# Patient Record
Sex: Female | Born: 1980 | Race: White | Hispanic: No | Smoking: Current every day smoker
Health system: Southern US, Community
[De-identification: ages and names within clinical notes are randomized; demographics above are authoritative.]

## PROBLEM LIST (undated history)

## (undated) DIAGNOSIS — R519 Headache, unspecified: Secondary | ICD-10-CM

## (undated) DIAGNOSIS — G2581 Restless legs syndrome: Secondary | ICD-10-CM

## (undated) DIAGNOSIS — F32A Depression, unspecified: Secondary | ICD-10-CM

## (undated) DIAGNOSIS — A6000 Herpesviral infection of urogenital system, unspecified: Secondary | ICD-10-CM

## (undated) DIAGNOSIS — F329 Major depressive disorder, single episode, unspecified: Secondary | ICD-10-CM

## (undated) DIAGNOSIS — G43909 Migraine, unspecified, not intractable, without status migrainosus: Secondary | ICD-10-CM

## (undated) DIAGNOSIS — R51 Headache: Secondary | ICD-10-CM

## (undated) HISTORY — DX: Major depressive disorder, single episode, unspecified: F32.9

## (undated) HISTORY — DX: Depression, unspecified: F32.A

## (undated) HISTORY — DX: Headache, unspecified: R51.9

## (undated) HISTORY — DX: Herpesviral infection of urogenital system, unspecified: A60.00

## (undated) HISTORY — DX: Headache: R51

## (undated) HISTORY — DX: Restless legs syndrome: G25.81

## (undated) HISTORY — DX: Migraine, unspecified, not intractable, without status migrainosus: G43.909

## (undated) HISTORY — PX: UMBILICAL HERNIA REPAIR: SHX196

---

## 2000-06-22 HISTORY — PX: LEEP: SHX91

## 2006-12-25 ENCOUNTER — Ambulatory Visit: Payer: Self-pay | Admitting: Emergency Medicine

## 2008-12-31 ENCOUNTER — Ambulatory Visit: Payer: Self-pay | Admitting: Internal Medicine

## 2010-12-22 ENCOUNTER — Ambulatory Visit: Payer: Self-pay | Admitting: Family Medicine

## 2012-06-05 ENCOUNTER — Ambulatory Visit: Payer: Self-pay | Admitting: Emergency Medicine

## 2012-07-22 IMAGING — CT CT HEAD WITHOUT CONTRAST
1 series · 16 of 28 positions shown, 20 images · non-contrast
Comparison: none

REASON FOR EXAM: HA blurred vision tremor difficulty with speech
COMMENTS:

[Series 2: 3 soft tissue · axial · 0.42mm/px · z∈[+425,+550]mm · 16 of 28 slices shown, 20 images]
[im 2/28  brain]
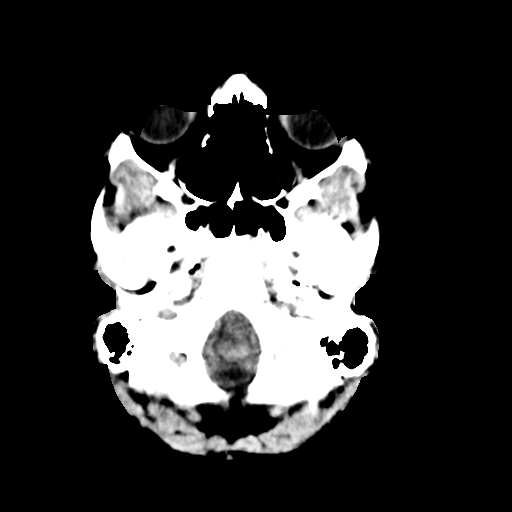
[im 2/28  bone]
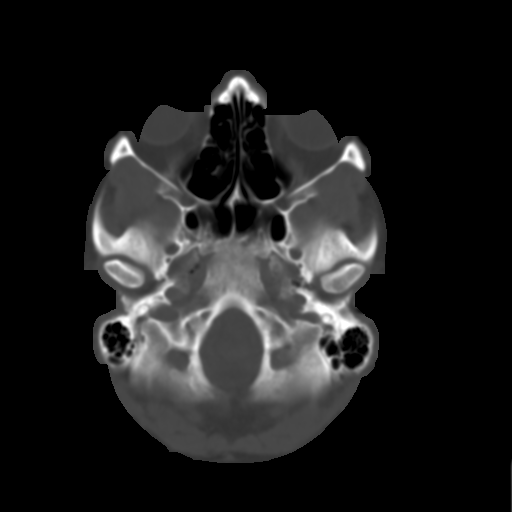
[im 4/28  brain]
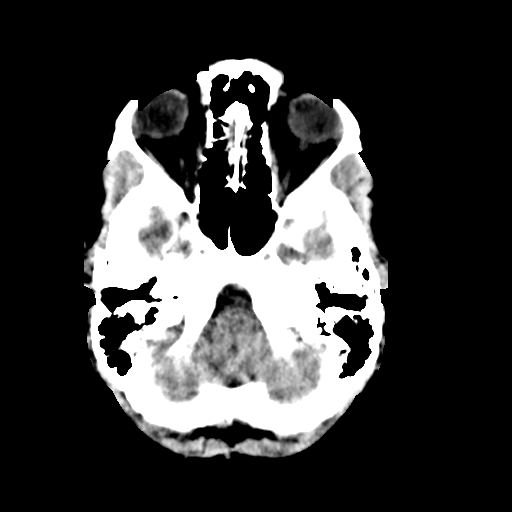
[im 6/28  brain]
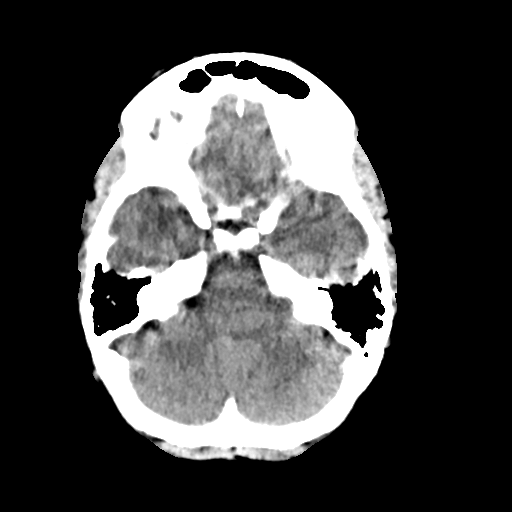
[im 7/28  brain]
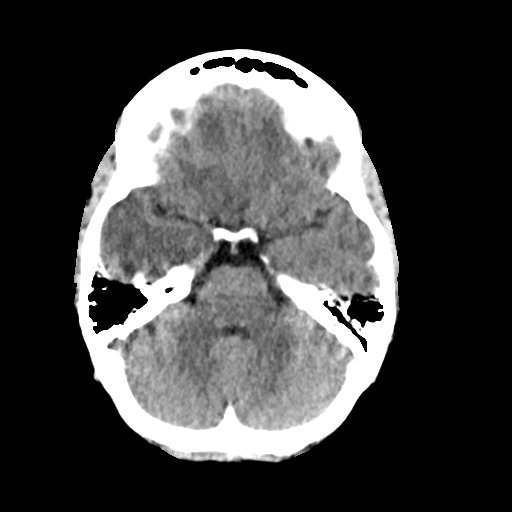
[im 9/28  brain]
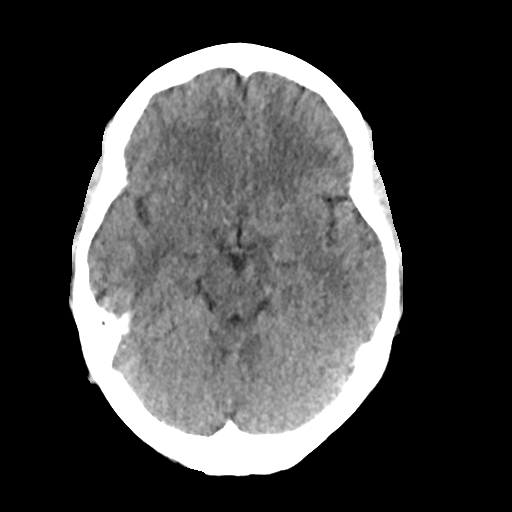
[im 9/28  bone]
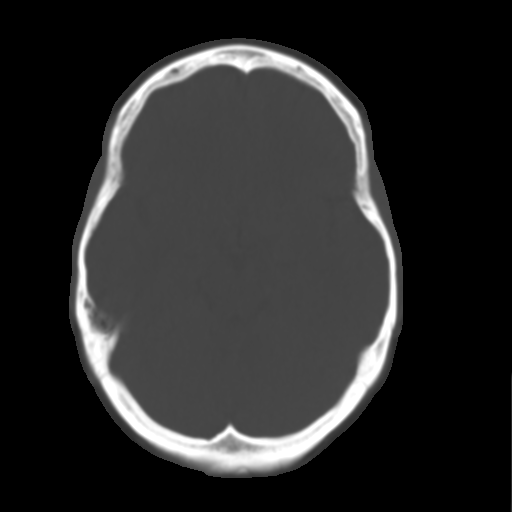
[im 10/28  brain]
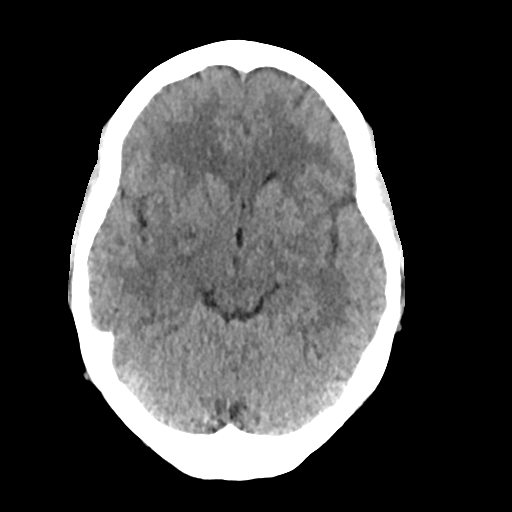
[im 12/28  brain]
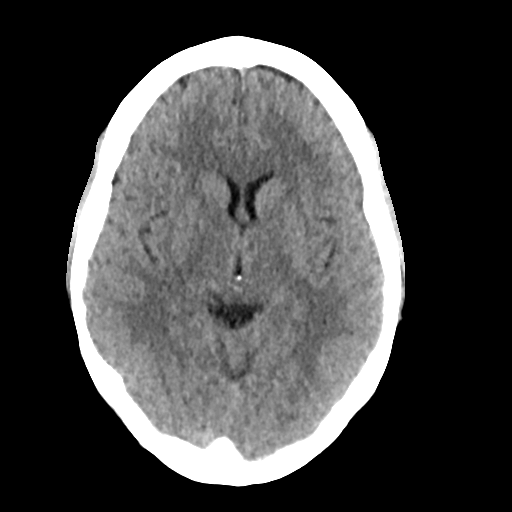
[im 14/28  brain]
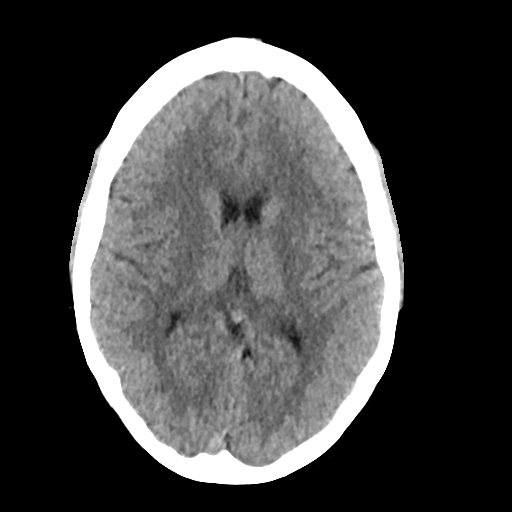
[im 15/28  brain]
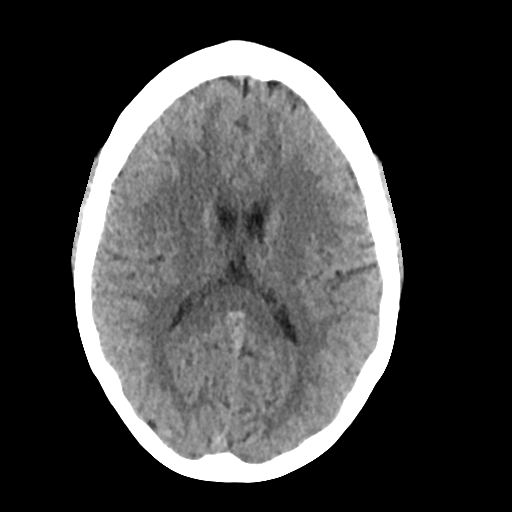
[im 15/28  bone]
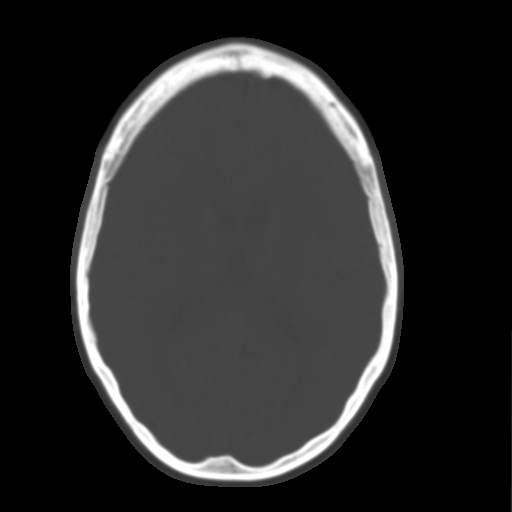
[im 17/28  brain]
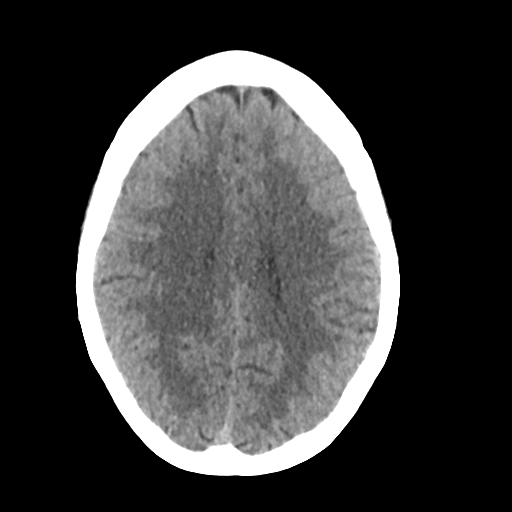
[im 19/28  brain]
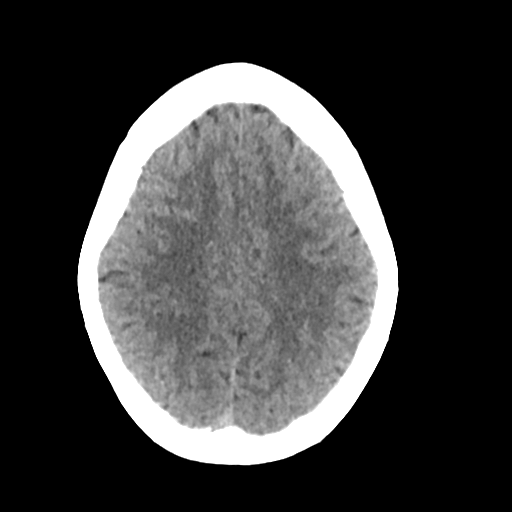
[im 20/28  brain]
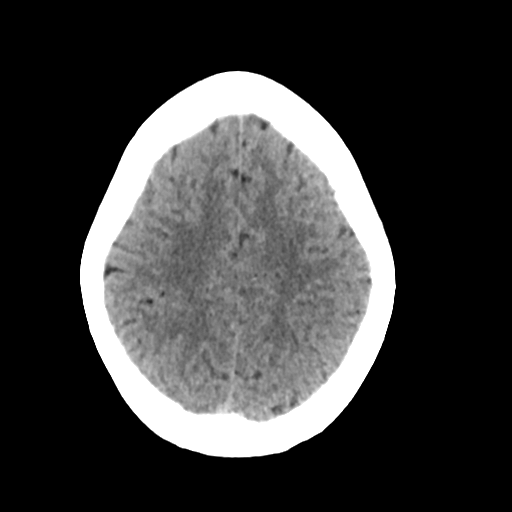
[im 22/28  brain]
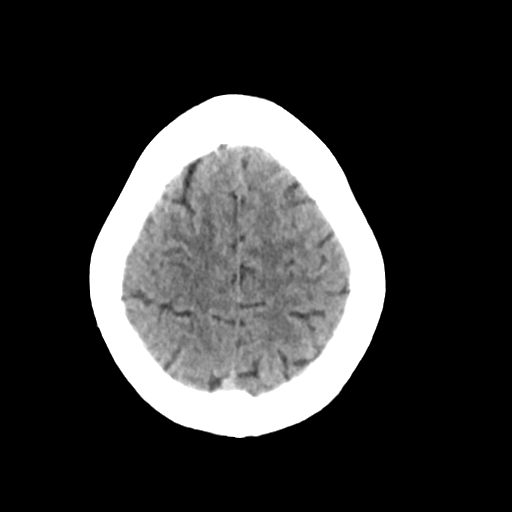
[im 22/28  bone]
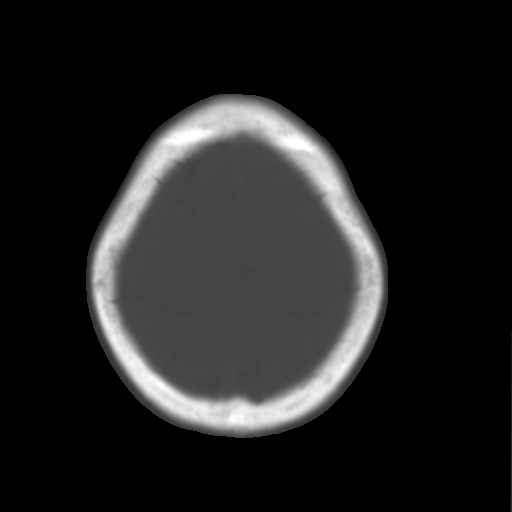
[im 23/28  brain]
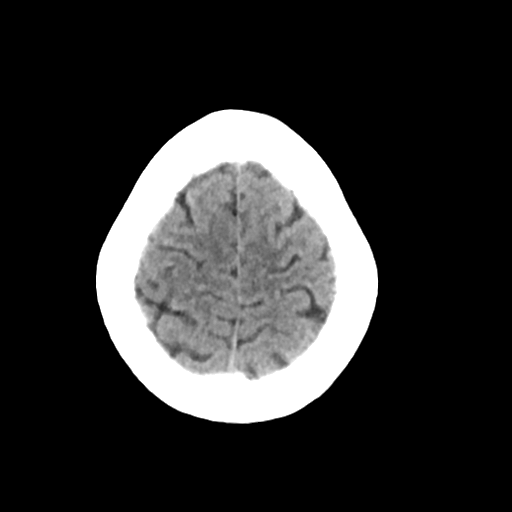
[im 25/28  brain]
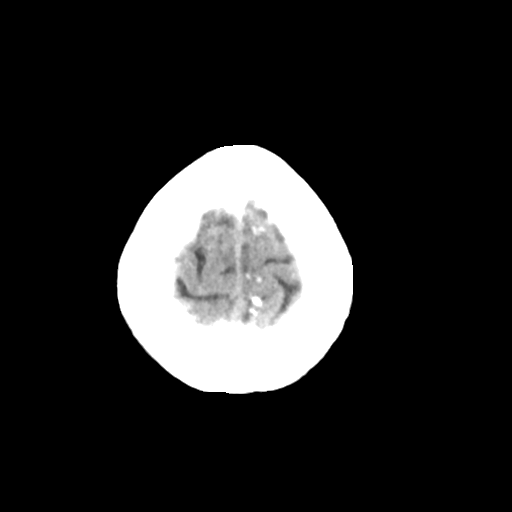
[im 27/28  brain]
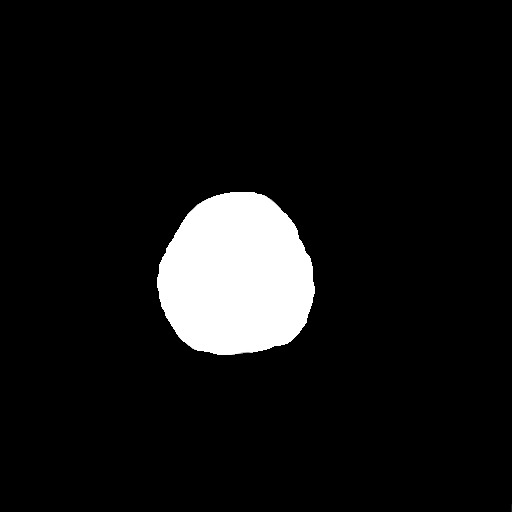

[16 of 28 positions shown; findings below may reference images not displayed]

PROCEDURE:     KCT - KCT HEAD WITHOUT CONTRAST  - December 22, 2010 [DATE]

RESULT:     Noncontrast CT of the brain is performed. The patient has no
previous exam for comparison.

The ventricles and sulci appear to be within normal limits. There is no
evidence of intracranial hemorrhage, mass or mass effect. There is no
midline shift or territorial infarct. The included sinuses and mastoid air
cells show normal aeration. The calvarium is intact.
IMPRESSION: No focal intracranial abnormality evident. If there is concern for occult
malignancy or an acute or subacute ischemic event, followup with MRI may be
beneficial.

## 2012-11-20 ENCOUNTER — Ambulatory Visit: Payer: Self-pay | Admitting: Family Medicine

## 2013-10-23 ENCOUNTER — Ambulatory Visit: Payer: Self-pay

## 2014-10-10 DIAGNOSIS — K429 Umbilical hernia without obstruction or gangrene: Secondary | ICD-10-CM | POA: Insufficient documentation

## 2015-02-04 ENCOUNTER — Ambulatory Visit (INDEPENDENT_AMBULATORY_CARE_PROVIDER_SITE_OTHER): Payer: BLUE CROSS/BLUE SHIELD | Admitting: Psychiatry

## 2015-02-04 ENCOUNTER — Encounter: Payer: Self-pay | Admitting: Psychiatry

## 2015-02-04 VITALS — BP 124/86 | HR 88 | Temp 98.9°F | Ht 63.0 in | Wt 216.2 lb

## 2015-02-04 DIAGNOSIS — R419 Unspecified symptoms and signs involving cognitive functions and awareness: Secondary | ICD-10-CM

## 2015-02-04 DIAGNOSIS — F99 Mental disorder, not otherwise specified: Secondary | ICD-10-CM

## 2015-02-04 MED ORDER — BUPROPION HCL ER (XL) 300 MG PO TB24: 300.0000 mg | ORAL_TABLET | Freq: Every day | ORAL | Status: DC
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
## 2015-03-19 ENCOUNTER — Ambulatory Visit: Payer: BLUE CROSS/BLUE SHIELD | Admitting: Psychiatry

## 2016-10-05 ENCOUNTER — Telehealth: Payer: Self-pay

## 2016-10-05 NOTE — Telephone Encounter (Signed)
Pt calling.  Has appt next week but need refill before then.  Pharm will not let her dot it.  [phone]

## 2016-10-05 NOTE — Telephone Encounter (Signed)
Left detailed msg.

## 2016-10-05 NOTE — Telephone Encounter (Signed)
We have not seen pt in almost 2 yrs. Whoever has been filling meds until now can RF until we see pt. RN to notify pt.

## 2016-10-13 ENCOUNTER — Ambulatory Visit: Payer: Self-pay | Admitting: Obstetrics and Gynecology

## 2016-10-26 ENCOUNTER — Ambulatory Visit: Payer: Self-pay | Admitting: Advanced Practice Midwife

## 2016-10-26 ENCOUNTER — Encounter: Payer: Self-pay | Admitting: Advanced Practice Midwife

## 2016-12-07 ENCOUNTER — Ambulatory Visit: Payer: Self-pay | Admitting: Obstetrics and Gynecology

## 2016-12-25 ENCOUNTER — Ambulatory Visit (INDEPENDENT_AMBULATORY_CARE_PROVIDER_SITE_OTHER): Payer: 59 | Admitting: Advanced Practice Midwife

## 2016-12-25 ENCOUNTER — Encounter: Payer: Self-pay | Admitting: Advanced Practice Midwife

## 2016-12-25 VITALS — BP 118/78 | Ht 62.0 in | Wt 195.0 lb

## 2016-12-25 DIAGNOSIS — B009 Herpesviral infection, unspecified: Secondary | ICD-10-CM | POA: Diagnosis not present

## 2016-12-25 DIAGNOSIS — Z01419 Encounter for gynecological examination (general) (routine) without abnormal findings: Secondary | ICD-10-CM | POA: Diagnosis not present

## 2016-12-25 MED ORDER — VALACYCLOVIR HCL 500 MG PO TABS: 500.0000 mg | ORAL_TABLET | Freq: Two times a day (BID) | ORAL | 2 refills | Status: AC

## 2016-12-25 NOTE — Progress Notes (Signed)
Patient ID: Jenna RileyHeidi H Pittman, female   DOB: 04/19/1981, 36 y.o.   MRN: [id number]     Gynecology Annual Exam  PCP: Patient, No Pcp Per  Chief Complaint:  Chief Complaint  Patient presents with  . Annual Exam    History of Present Illness: Patient is a 36 y.o. G0P0000 presents for annual exam. The patient has complaint today of resolving pimple at 1:00 o'clock on outer edge of right breast. She first noticed it about 6 months ago as a white head and she says it is getting smaller. She does not have concerns about it but is told to let us know if she does have concerns that we can schedule a breast ultrasound. Patient is requesting refill of valtrex today for q 6 month breakouts.   LMP: No LMP recorded. Patient is not currently having periods (Reason: IUD). Average Interval: NA Duration of flow: NA days Heavy Menses: not applicable Clots: not applicable Intermenstrual Bleeding: no Postcoital Bleeding: no Dysmenorrhea: no Spotting q 6 months  The patient is sexually active. She currently uses IUD for contraception. She denies dyspareunia.  The patient does not perform self breast exams.  There is no notable family history of breast or ovarian cancer in her family.  The patient wears seatbelts: yes.   The patient has regular exercise: yes.  She admits healthy diet. She is a 1/4 PPD cigarette smoker and states she is not ready to quit.  The patient denies current symptoms of depression.    Review of Systems: Review of Systems  Constitutional: Negative.   HENT: Negative.   Eyes: Negative.   Respiratory: Negative.   Cardiovascular: Negative.   Gastrointestinal: Negative.   Genitourinary: Negative.   Musculoskeletal: Negative.   Skin: Negative.   Neurological: Negative.   Endo/Heme/Allergies: Negative.   Psychiatric/Behavioral: Negative.     Past Medical History:  Past Medical History:  Diagnosis Date  . Depression   . Headache   . Herpes genitalia   . Migraine   .  Restless leg syndrome     Past Surgical History:  Past Surgical History:  Procedure Laterality Date  . LEEP  2002  . UMBILICAL HERNIA REPAIR      Gynecologic History:  No LMP recorded. Patient is not currently having periods (Reason: IUD). Contraception: IUD Last Pap: Results were: no abnormalities   Obstetric History: G0P0000  Family History:  Family History  Problem Relation Age of Onset  . Hypertension Mother   . Depression Mother   . Hypertension Father   . Hyperlipidemia Father   . Skin cancer Father     Social History:  Social History   Social History  . Marital status: Divorced    Spouse name: N/A  . Number of children: N/A  . Years of education: N/A   Occupational History  . Not on file.   Social History Main Topics  . Smoking status: Current Every Day Smoker    Packs/day: 0.25    Types: Cigarettes    Start date: 02/03/2010  . Smokeless tobacco: Never Used  . Alcohol use 3.0 oz/week    3 Cans of beer, 2 Shots of liquor per week  . Drug use: No  . Sexual activity: Yes    Birth control/ protection: None   Other Topics Concern  . Not on file   Social History Narrative  . No narrative on file    Allergies:  No Known Allergies  Medications: Prior to Admission medications   Medication Sig  Start Date End Date Taking? Authorizing Provider  valACYclovir (VALTREX) 500 MG tablet Take by mouth.   Yes [provider]  buPROPion (WELLBUTRIN XL) 300 MG 24 hr tablet Take 1 tablet (300 mg total) by mouth daily. Patient not taking: Reported on 12/25/2016 02/04/15   Kerin Salen, MD    Physical Exam Vitals: Blood pressure 118/78, height 5\' 2"  (1.575 m), weight 195 lb (88.5 kg).  General: NAD HEENT: normocephalic, anicteric Thyroid: no enlargement, no palpable nodules Pulmonary: No increased work of breathing, CTAB Cardiovascular: RRR, distal pulses 2+ Breast: Breast symmetrical, no tenderness, no palpable nodules or masses, no skin or nipple  retraction present, no nipple discharge.  No axillary or supraclavicular lymphadenopathy. Superficial small bump on outer edge at 1 o'clock on right breast, non tender Abdomen: NABS, soft, non-tender, non-distended.  Umbilicus without lesions.  No hepatomegaly, splenomegaly or masses palpable. No evidence of hernia  Genitourinary: deferred for PAP smear screening interval and no gyn concerns.  Extremities: no edema, erythema, or tenderness Neurologic: Grossly intact Psychiatric: mood appropriate, affect full   Assessment: 36 y.o. G0P0000 Well woman exam  Plan: Problem List Items Addressed This Visit    None    Visit Diagnoses    Well woman exam with routine gynecological exam    -  Primary      1) STI screening was offered and declined  2) ASCCP guidelines and rational discussed.  Patient opts for every 3 years screening interval  3) Contraception -Continue with IUD  4) Routine healthcare maintenance including cholesterol, diabetes screening discussed managed by PCP   5) Refill of Valtrex  6) Follow up 1 year for routine annual exam and as needed PRN   Tresea Mall, CNM

## 2017-10-20 ENCOUNTER — Ambulatory Visit (INDEPENDENT_AMBULATORY_CARE_PROVIDER_SITE_OTHER): Payer: 59 | Admitting: Advanced Practice Midwife

## 2017-10-20 ENCOUNTER — Encounter: Payer: Self-pay | Admitting: Advanced Practice Midwife

## 2017-10-20 VITALS — BP 120/80 | Ht 63.0 in | Wt 200.0 lb

## 2017-10-20 DIAGNOSIS — N6312 Unspecified lump in the right breast, upper inner quadrant: Secondary | ICD-10-CM

## 2017-10-20 DIAGNOSIS — A6000 Herpesviral infection of urogenital system, unspecified: Secondary | ICD-10-CM | POA: Diagnosis not present

## 2017-10-20 MED ORDER — VALACYCLOVIR HCL 500 MG PO TABS: 500.0000 mg | ORAL_TABLET | Freq: Two times a day (BID) | ORAL | 6 refills | Status: AC

## 2017-10-20 NOTE — Progress Notes (Signed)
Patient ID: Jenna Pittman, female   DOB: 02-Jul-1980, 37 y.o.   MRN: [id number]  Reason for Consult: Breast Mass   Referred by No ref. provider found  Subjective:     HPI: Patient was seen by me 10 months ago and at that time had a small lump on her right breast that began as a pimple and she thought it was resolving. She was not concerned about it at that time but was told to follow up if it got worse. She is here today because the lump has gotten bigger and the color has changed. It is visible when she wears a lower cut top. She denies pain at the site. She is requesting imaging to make sure it is nothing concerning. She is also requesting refill of Valacyclovir for her occasional HSV outbreaks.   Past Medical History:  Diagnosis Date  . Depression   . Headache   . Herpes genitalia   . Migraine   . Restless leg syndrome    Family History  Problem Relation Age of Onset  . Hypertension Mother   . Depression Mother   . Hypertension Father   . Hyperlipidemia Father   . Skin cancer Father    Past Surgical History:  Procedure Laterality Date  . LEEP  2002  . UMBILICAL HERNIA REPAIR      Short Social History:  Social History   Tobacco Use  . Smoking status: Current Every Day Smoker    Packs/day: 0.25    Types: Cigarettes    Start date: 02/03/2010  . Smokeless tobacco: Never Used  Substance Use Topics  . Alcohol use: Yes    Alcohol/week: 3.0 oz    Types: 3 Cans of beer, 2 Shots of liquor per week    No Known Allergies  Current Outpatient Medications  Medication Sig Dispense Refill  . valACYclovir (VALTREX) 500 MG tablet Take 1 tablet (500 mg total) by mouth 2 (two) times daily for 3 days. 6 tablet 6   No current facility-administered medications for this visit.     Review of Systems  Constitutional:  Constitutional negative. HENT:       Congestion Eyes: Eyes negative.  Respiratory: Positive for cough.  Cardiovascular: Cardiovascular negative.  GI:  Gastrointestinal negative.  GU: Genitourinary negative. Musculoskeletal: Musculoskeletal negative.  Skin:       Lump on right breast that has gotten bigger over the past 10 months Neurological: Neurological negative. Hematologic: Hematologic/lymphatic negative.  Psychiatric: Psychiatric negative.        Objective:  Objective   Vitals:   10/20/17 1348  BP: 120/80  Weight: 200 lb (90.7 kg)  Height:  (1.6 m)   Body mass index is 35.43 kg/m.  Physical Exam  Constitutional: She is oriented to person, place, and time. She appears well-developed and well-nourished.  HENT:  Head: Normocephalic.  Neck: Normal range of motion. Neck supple.  Cardiovascular: Normal rate and regular rhythm.  Pulmonary/Chest: Effort normal and breath sounds normal.  Musculoskeletal: Normal range of motion.  Neurological: She is alert and oriented to person, place, and time.  Skin: Skin is warm and dry.  1 cm x 1 cm raised lump at 1:00 on right breast. Non mobile. Non tender. Lump appears white under surface of skin with red skin around lump. Skin intact.   Psychiatric: She has a normal mood and affect. Her behavior is normal.        Assessment/Plan:    37 yo G0P0 female with Right breast  lump   Orders placed for bilateral diagnostic mammogram and bilateral ultrasound   Tresea Mall CNM [facility]

## 2017-10-21 ENCOUNTER — Telehealth: Payer: Self-pay | Admitting: Advanced Practice Midwife

## 2017-10-21 NOTE — Telephone Encounter (Signed)
I attempted to reach the patient to make her aware of an appointment at [facility] on Wed, 10/27/17 @ 2:40pm (first available) but there was no answer, and the v/m is full. No other phone#s listed in chart.

## 2017-10-22 ENCOUNTER — Telehealth: Payer: Self-pay | Admitting: Advanced Practice Midwife

## 2017-10-22 ENCOUNTER — Ambulatory Visit: Payer: 59 | Admitting: Maternal Newborn

## 2017-10-22 NOTE — Telephone Encounter (Signed)
Patient is aware of appointment and location.

## 2017-10-27 ENCOUNTER — Ambulatory Visit
Admission: RE | Admit: 2017-10-27 | Discharge: 2017-10-27 | Disposition: A | Discharge status: Home / Self Care | Payer: 59 | Source: Ambulatory Visit | Attending: Advanced Practice Midwife | Admitting: Advanced Practice Midwife

## 2017-10-27 DIAGNOSIS — N6312 Unspecified lump in the right breast, upper inner quadrant: Secondary | ICD-10-CM | POA: Insufficient documentation

## 2017-10-27 DIAGNOSIS — R928 Other abnormal and inconclusive findings on diagnostic imaging of breast: Secondary | ICD-10-CM | POA: Diagnosis not present

## 2017-10-29 ENCOUNTER — Telehealth: Payer: Self-pay

## 2017-10-29 NOTE — Telephone Encounter (Signed)
Pt calling stating she had her mammo Wednesday and they advised her to have surgery/lumpectomy of breast. She is requesting everything be done at [facility]. fwding to J[facility] for when she reviews these results.

## 2017-11-02 NOTE — Telephone Encounter (Signed)
Pt calling to check on status. Pt aware that orders have been sent to [facility], just waiting on jane to be in office to sign orders. Erskine Squibb is back in office tomorrow and we will get this taken care of. Pt appreciative

## 2017-11-03 ENCOUNTER — Other Ambulatory Visit: Payer: Self-pay | Admitting: Advanced Practice Midwife

## 2017-11-03 DIAGNOSIS — N6312 Unspecified lump in the right breast, upper inner quadrant: Secondary | ICD-10-CM

## 2017-11-03 NOTE — Progress Notes (Signed)
Referral sent for [facility] regarding suspicious right breast mass.

## 2017-11-03 NOTE — Telephone Encounter (Signed)
Patient is aware of scheduled consult with [facility].

## 2017-11-08 DIAGNOSIS — N6312 Unspecified lump in the right breast, upper inner quadrant: Secondary | ICD-10-CM | POA: Diagnosis not present

## 2017-11-09 DIAGNOSIS — N6312 Unspecified lump in the right breast, upper inner quadrant: Secondary | ICD-10-CM | POA: Diagnosis not present

## 2017-11-24 DIAGNOSIS — N6312 Unspecified lump in the right breast, upper inner quadrant: Secondary | ICD-10-CM | POA: Diagnosis not present

## 2017-11-24 DIAGNOSIS — N63 Unspecified lump in unspecified breast: Secondary | ICD-10-CM | POA: Diagnosis not present

## 2018-02-23 DIAGNOSIS — Z3009 Encounter for other general counseling and advice on contraception: Secondary | ICD-10-CM | POA: Diagnosis not present

## 2018-02-23 DIAGNOSIS — Z0000 Encounter for general adult medical examination without abnormal findings: Secondary | ICD-10-CM | POA: Diagnosis not present

## 2018-02-23 DIAGNOSIS — Z01419 Encounter for gynecological examination (general) (routine) without abnormal findings: Secondary | ICD-10-CM | POA: Diagnosis not present

## 2018-03-02 DIAGNOSIS — Z0000 Encounter for general adult medical examination without abnormal findings: Secondary | ICD-10-CM | POA: Diagnosis not present

## 2018-03-28 DIAGNOSIS — N898 Other specified noninflammatory disorders of vagina: Secondary | ICD-10-CM | POA: Diagnosis not present

## 2018-03-28 DIAGNOSIS — Z30432 Encounter for removal of intrauterine contraceptive device: Secondary | ICD-10-CM | POA: Diagnosis not present

## 2018-03-28 DIAGNOSIS — R350 Frequency of micturition: Secondary | ICD-10-CM | POA: Diagnosis not present

## 2018-03-28 DIAGNOSIS — R300 Dysuria: Secondary | ICD-10-CM | POA: Diagnosis not present

## 2018-03-28 DIAGNOSIS — Z3200 Encounter for pregnancy test, result unknown: Secondary | ICD-10-CM | POA: Diagnosis not present

## 2018-04-12 DIAGNOSIS — J209 Acute bronchitis, unspecified: Secondary | ICD-10-CM | POA: Diagnosis not present

## 2018-04-26 DIAGNOSIS — N949 Unspecified condition associated with female genital organs and menstrual cycle: Secondary | ICD-10-CM | POA: Diagnosis not present

## 2018-04-26 DIAGNOSIS — Z30431 Encounter for routine checking of intrauterine contraceptive device: Secondary | ICD-10-CM | POA: Diagnosis not present

## 2018-04-26 DIAGNOSIS — N898 Other specified noninflammatory disorders of vagina: Secondary | ICD-10-CM | POA: Diagnosis not present

## 2018-04-28 DIAGNOSIS — N6081 Other benign mammary dysplasias of right breast: Secondary | ICD-10-CM | POA: Diagnosis not present

## 2018-05-03 DIAGNOSIS — R928 Other abnormal and inconclusive findings on diagnostic imaging of breast: Secondary | ICD-10-CM | POA: Diagnosis not present

## 2018-05-03 DIAGNOSIS — N6310 Unspecified lump in the right breast, unspecified quadrant: Secondary | ICD-10-CM | POA: Diagnosis not present

## 2018-05-05 DIAGNOSIS — R197 Diarrhea, unspecified: Secondary | ICD-10-CM | POA: Diagnosis not present

## 2018-05-06 DIAGNOSIS — R197 Diarrhea, unspecified: Secondary | ICD-10-CM | POA: Diagnosis not present

## 2018-05-10 DIAGNOSIS — L720 Epidermal cyst: Secondary | ICD-10-CM | POA: Diagnosis not present

## 2018-05-27 DIAGNOSIS — L720 Epidermal cyst: Secondary | ICD-10-CM | POA: Diagnosis not present

## 2018-06-07 ENCOUNTER — Encounter: Payer: Self-pay | Admitting: Emergency Medicine

## 2018-06-07 ENCOUNTER — Ambulatory Visit
Admission: EM | Admit: 2018-06-07 | Discharge: 2018-06-07 | Disposition: A | Discharge status: Home / Self Care | Payer: 59 | Attending: Family Medicine | Admitting: Family Medicine

## 2018-06-07 ENCOUNTER — Other Ambulatory Visit: Payer: Self-pay

## 2018-06-07 DIAGNOSIS — J0100 Acute maxillary sinusitis, unspecified: Secondary | ICD-10-CM | POA: Diagnosis not present

## 2018-06-07 MED ORDER — HYDROCOD POLST-CPM POLST ER 10-8 MG/5ML PO SUER: 5.0000 mL | Freq: Two times a day (BID) | ORAL | 0 refills | Status: DC

## 2018-06-07 MED ORDER — FLUTICASONE PROPIONATE 50 MCG/ACT NA SUSP: 2.0000 | Freq: Every day | NASAL | 0 refills | Status: AC

## 2018-06-07 MED ORDER — BENZONATATE 200 MG PO CAPS: ORAL_CAPSULE | ORAL | 0 refills | Status: DC

## 2018-06-07 NOTE — ED Triage Notes (Signed)
Patient c/o sinus congestion and pressure that started last Friday.

## 2018-06-07 NOTE — ED Provider Notes (Addendum)
[facility]    CSN: [id number] Arrival date & time: 06/07/18  1443     History   Chief Complaint Chief Complaint  Patient presents with  . Sinus Problem    HPI Jenna Pittman is a 37 y.o. female.   HPI     -year-old female presents with sinus congestion and pressure that started last Friday.  He is complaining of nasal discharge facial pain over the maxillary and frontal sinus area.  Her ears have been and she has a cough.  She has had chills but no fever. She jusrt finished a course of antibiotics for C. difficile (vancomycin) its that her stomach still has not recovered from the amount of, she had with her C. difficile illness.  She is afebrile.  O2 sats are 100% room air       Past Medical History:  Diagnosis Date  . Depression   . Headache   . Herpes genitalia   . Migraine   . Restless leg syndrome     Patient Active Problem List   Diagnosis Date Noted  . Umbilical hernia without obstruction and without gangrene 10/10/2014    Past Surgical History:  Procedure Laterality Date  . LEEP  2002  . UMBILICAL HERNIA REPAIR      OB History    Gravida  0   Para  0   Term  0   Preterm  0   AB  0   Living  0     SAB  0   TAB  0   Ectopic  0   Multiple  0   Live Births  0            Home Medications    Prior to Admission medications   Medication Sig Start Date End Date Taking? Authorizing Provider  levonorgestrel (MIRENA) 20 MCG/24HR IUD 1 each by Intrauterine route once.   Yes [provider]  benzonatate (TESSALON) 200 MG capsule Take one cap TID PRN cough 06/07/18   Lutricia Feil, PA-C  chlorpheniramine-HYDROcodone [facility]) 10-8 MG/5ML SUER Take 5 mLs by mouth 2 (two) times daily. 06/07/18   Lutricia Feil, PA-C  fluticasone (FLONASE) 50 MCG/ACT nasal spray Place 2 sprays into both nostrils daily. 06/07/18   Lutricia Feil, PA-C    Family History Family History  Problem Relation Age of  Onset  . Hypertension Mother   . Depression Mother   . Hypertension Father   . Hyperlipidemia Father   . Skin cancer Father     Social History Social History   Tobacco Use  . Smoking status: Current Every Day Smoker    Packs/day: 0.25    Types: Cigarettes    Start date: 02/03/2010  . Smokeless tobacco: Never Used  Substance Use Topics  . Alcohol use: Yes    Alcohol/week: 5.0 standard drinks    Types: 3 Cans of beer, 2 Shots of liquor per week  . Drug use: No     Allergies   Patient has no known allergies.   Review of Systems Review of Systems  Constitutional: Positive for activity change, appetite change and chills. Negative for fever.  HENT: Positive for congestion, ear pain, postnasal drip, sinus pressure and sinus pain.   Respiratory: Positive for cough.   All other systems reviewed and are negative.    Physical Exam Triage Vital Signs ED Triage Vitals  Enc Vitals Group     BP 06/07/18 1458 (!) 135/97  Pulse Rate 06/07/18 1458 70     Resp 06/07/18 1458 16     Temp 06/07/18 1458 97.9 F (36.6 C)     Temp Source 06/07/18 1458 Oral     SpO2 06/07/18 1458 100 %     Weight 06/07/18 1455 180 lb (81.6 kg)     Height 06/07/18 1455 5\' 3"  (1.6 m)     Head Circumference --      Peak Flow --      Pain Score 06/07/18 1455 3     Pain Loc --      Pain Edu? --      Excl. in [place]? --    No data found.  Updated Vital Signs BP (!) 135/97 (BP Location: Left Arm)   Pulse 70   Temp 97.9 F (36.6 C) (Oral)   Resp 16   Ht 5\' 3"  (1.6 m)   Wt 180 lb (81.6 kg)   SpO2 100%   BMI 31.89 kg/m   Visual Acuity Right Eye Distance:   Left Eye Distance:   Bilateral Distance:    Right Eye Near:   Left Eye Near:    Bilateral Near:     Physical Exam Vitals signs and nursing note reviewed.  Constitutional:      General: She is not in acute distress.    Appearance: Normal appearance. She is normal weight. She is not ill-appearing, toxic-appearing or diaphoretic.  HENT:      Head: Normocephalic.     Right Ear: Tympanic membrane, ear canal and external ear normal.     Left Ear: Tympanic membrane, ear canal and external ear normal.     Nose: Congestion and rhinorrhea present.     Mouth/Throat:     Mouth: Mucous membranes are moist.     Pharynx: Oropharynx is clear. No oropharyngeal exudate or posterior oropharyngeal erythema.  Eyes:     General:        Right eye: No discharge.        Left eye: No discharge.     Conjunctiva/sclera: Conjunctivae normal.     Pupils: Pupils are equal, round, and reactive to light.  Neck:     Musculoskeletal: Normal range of motion and neck supple. No muscular tenderness.  Pulmonary:     Effort: Pulmonary effort is normal.     Breath sounds: Normal breath sounds.  Musculoskeletal: Normal range of motion.  Lymphadenopathy:     Cervical: No cervical adenopathy.  Skin:    General: Skin is warm and dry.  Neurological:     General: No focal deficit present.     Mental Status: She is alert and oriented to person, place, and time.  Psychiatric:        Mood and Affect: Mood normal.        Behavior: Behavior normal.        Thought Content: Thought content normal.        Judgment: Judgment normal.      UC Treatments / Results  Labs (all labs ordered are listed, but only abnormal results are displayed) Labs Reviewed - No data to display  EKG None  Radiology No results found.  Procedures Procedures (including critical care time)  Medications Ordered in UC Medications - No data to display  Initial Impression / Assessment and Plan / UC Course  I have reviewed the triage vital signs and the nursing notes.  Pertinent labs & imaging results that were available during my care of the patient were reviewed by  me and considered in my medical decision making (see chart for details).   I told the patient that would prefer not to start on any antibiotics since she is just been treated for C. difficile and do not want to  exacerbate problems.  In this regard we will treat her symptomatically.  I will treat her cough with cough suppressants.  I recommended the use of Flonase nasal spray on a daily basis for the next 2 to 3 weeks.  If she continues to have problems she will follow-up with primary care or ear nose and throat specialist.   Final Clinical Impressions(s) / UC Diagnoses   Final diagnoses:  Acute maxillary sinusitis, recurrence not specified   Discharge Instructions   None    ED Prescriptions    Medication Sig Dispense Auth. Provider   fluticasone (FLONASE) 50 MCG/ACT nasal spray Place 2 sprays into both nostrils daily. 16 g Ovid Curdoemer, William P, PA-C   benzonatate (TESSALON) 200 MG capsule Take one cap TID PRN cough 30 capsule Lutricia Feiloemer, William P, PA-C   chlorpheniramine-HYDROcodone (TUSSIONEX PENNKINETIC ER) 10-8 MG/5ML SUER Take 5 mLs by mouth 2 (two) times daily. 115 mL Lutricia Feiloemer, William P, PA-C     Controlled Substance Prescriptions  Controlled Substance Registry consulted? Not Applicable   Lutricia FeilRoemer, William P, PA-C 06/07/18 1548    Lutricia Feiloemer, William P, PA-C 06/07/18 1549

## 2018-07-04 DIAGNOSIS — N6310 Unspecified lump in the right breast, unspecified quadrant: Secondary | ICD-10-CM | POA: Diagnosis not present

## 2018-07-04 DIAGNOSIS — R2231 Localized swelling, mass and lump, right upper limb: Secondary | ICD-10-CM | POA: Diagnosis not present

## 2018-07-04 DIAGNOSIS — L720 Epidermal cyst: Secondary | ICD-10-CM | POA: Diagnosis not present

## 2018-07-18 DIAGNOSIS — L720 Epidermal cyst: Secondary | ICD-10-CM | POA: Diagnosis not present

## 2018-07-18 DIAGNOSIS — F17210 Nicotine dependence, cigarettes, uncomplicated: Secondary | ICD-10-CM | POA: Diagnosis not present

## 2018-08-03 DIAGNOSIS — L720 Epidermal cyst: Secondary | ICD-10-CM | POA: Diagnosis not present

## 2019-03-19 IMAGING — US US BREAST*R* LIMITED INC AXILLA
1 series · 8 of 8 positions shown · non-contrast
Comparison: None.

CLINICAL DATA: Patient describes a firm lump in the upper inner
quadrant of the RIGHT breast. This is patient's baseline mammogram.

EXAM:
DIGITAL DIAGNOSTIC BILATERAL MAMMOGRAM WITH CAD AND TOMO
ULTRASOUND RIGHT BREAST

[Series 1: us breast*right* limited inc axilla · 0.06mm/px · 8 of 8 slices shown]
[im 1/8]
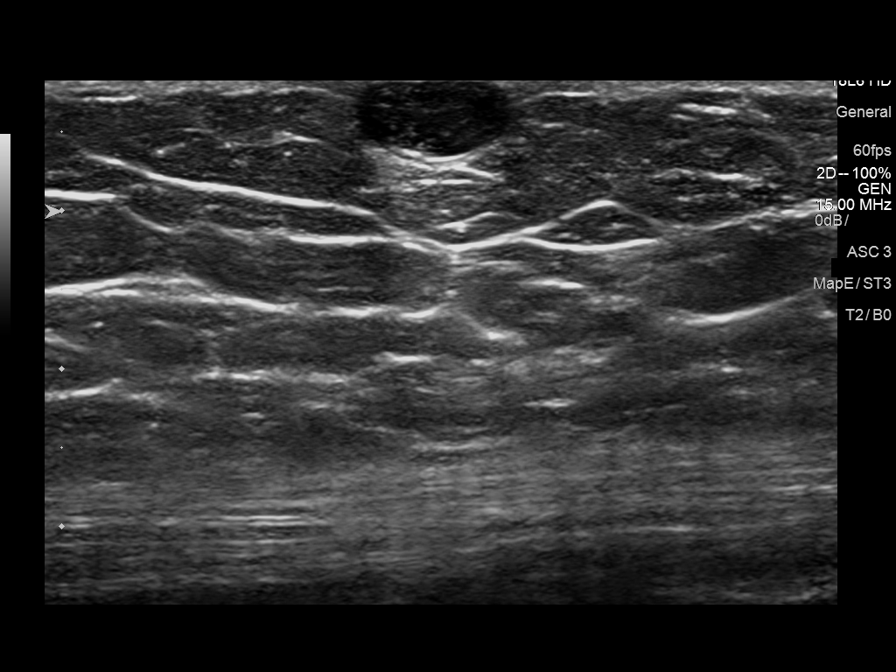
[im 2/8]
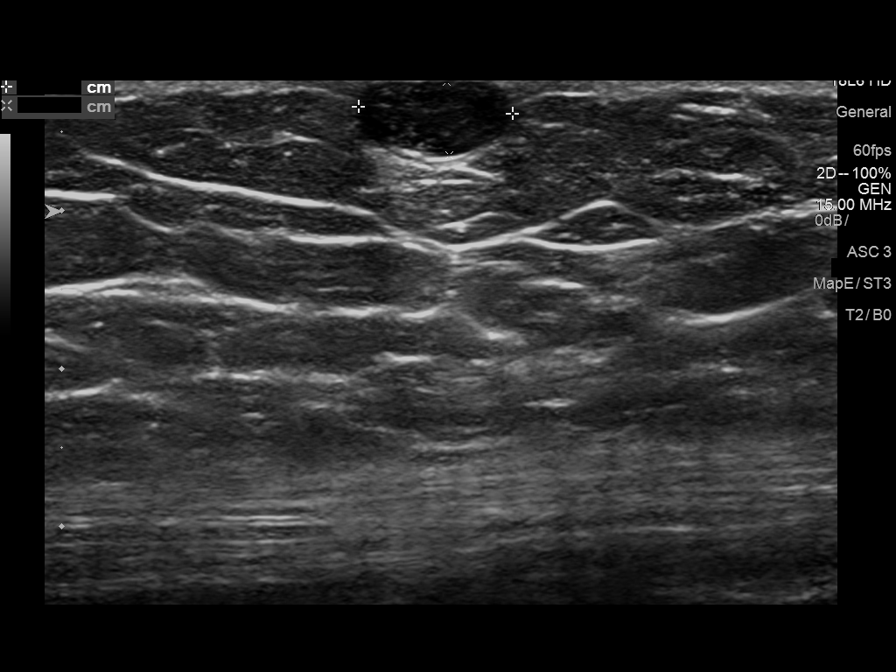
[im 3/8]
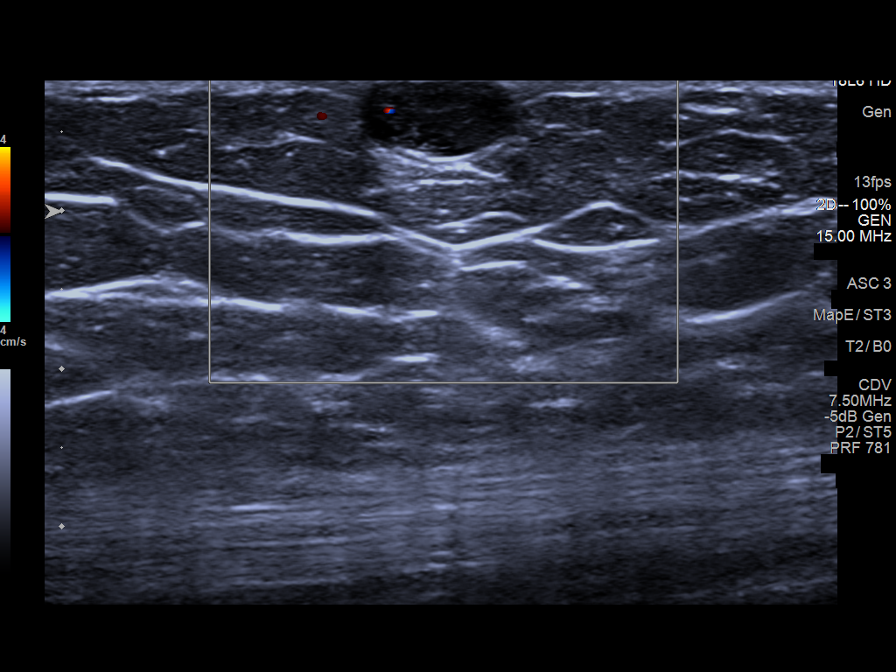
[im 4/8]
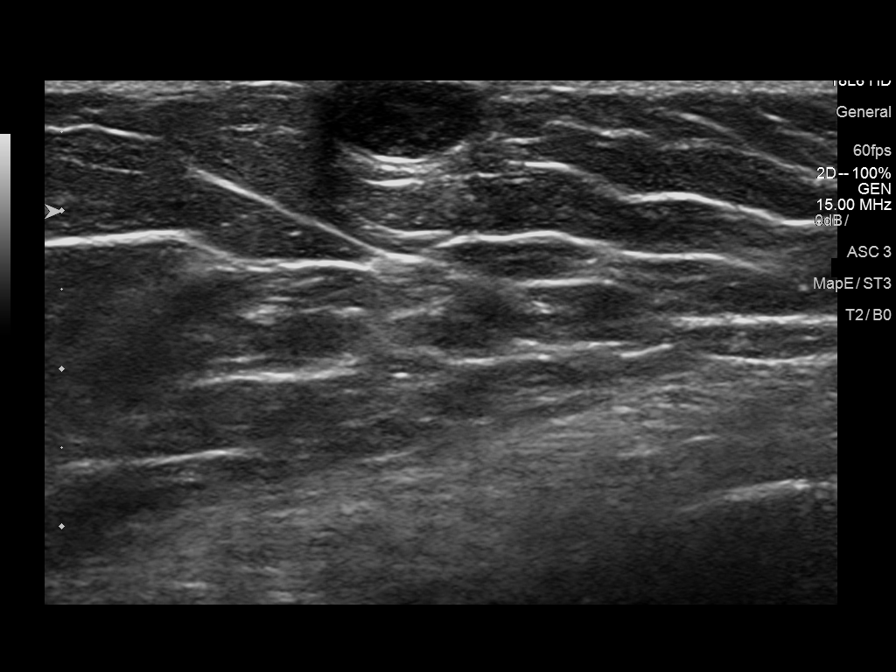
[im 5/8]
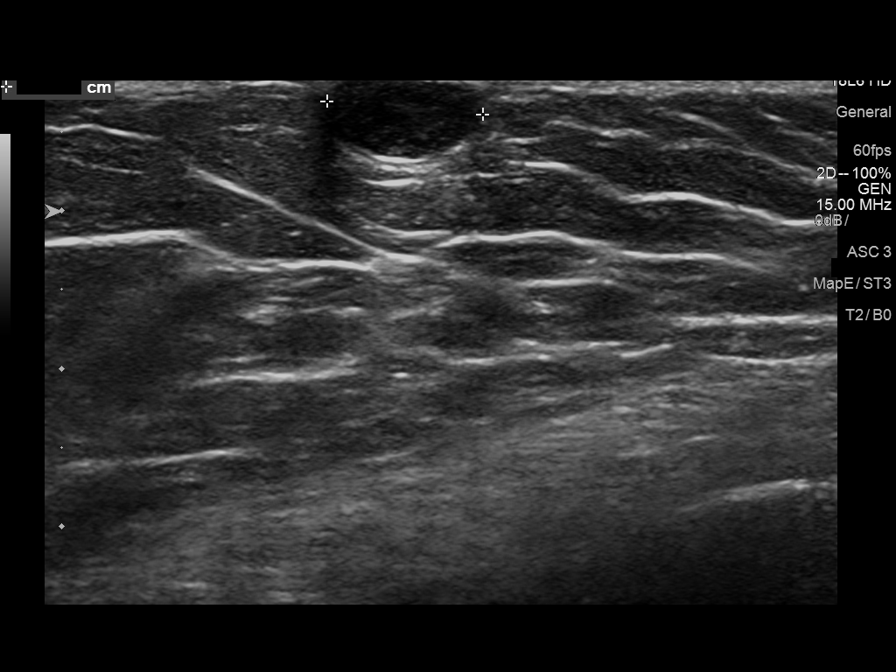
[im 6/8]
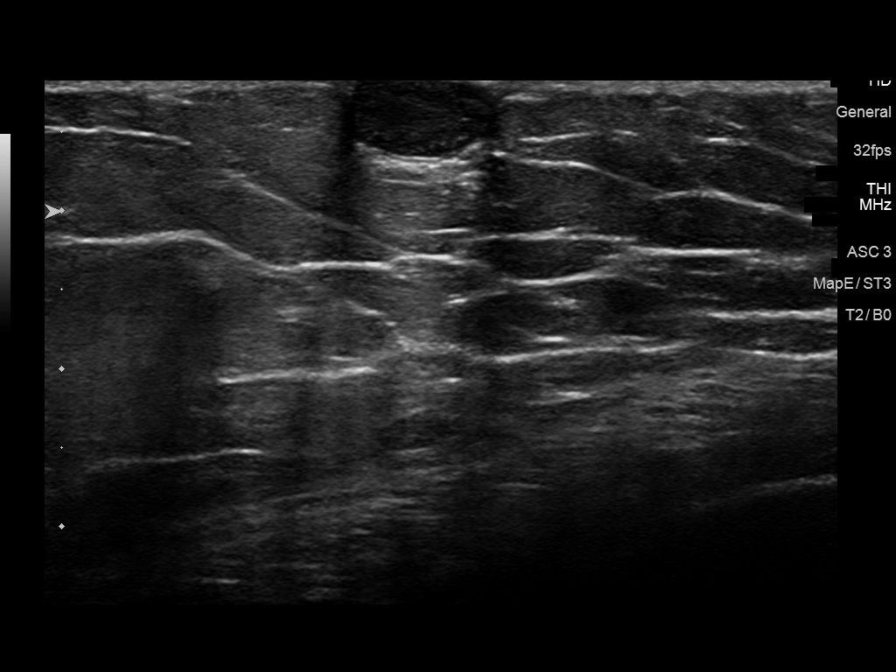
[im 7/8]
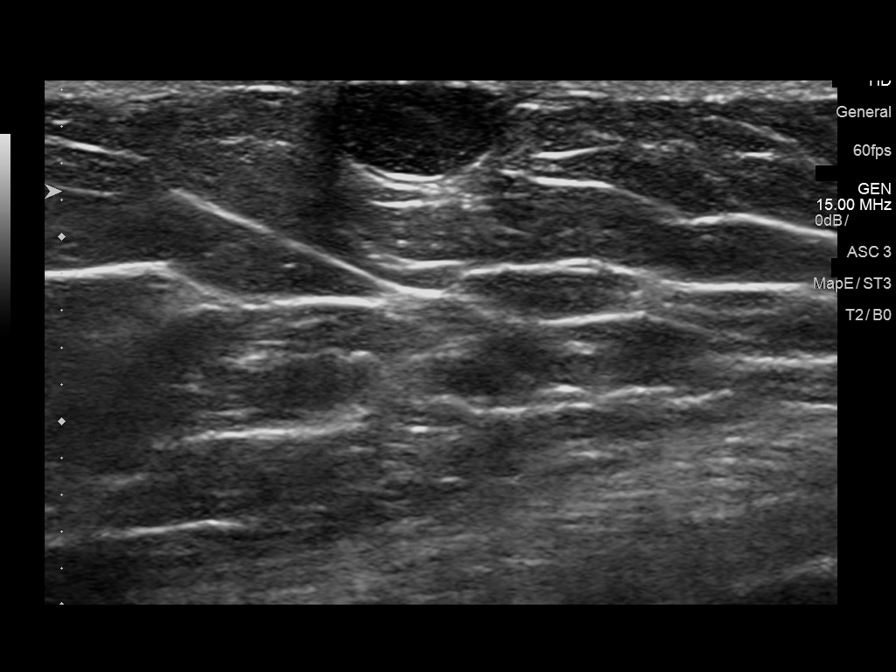
[im 8/8]
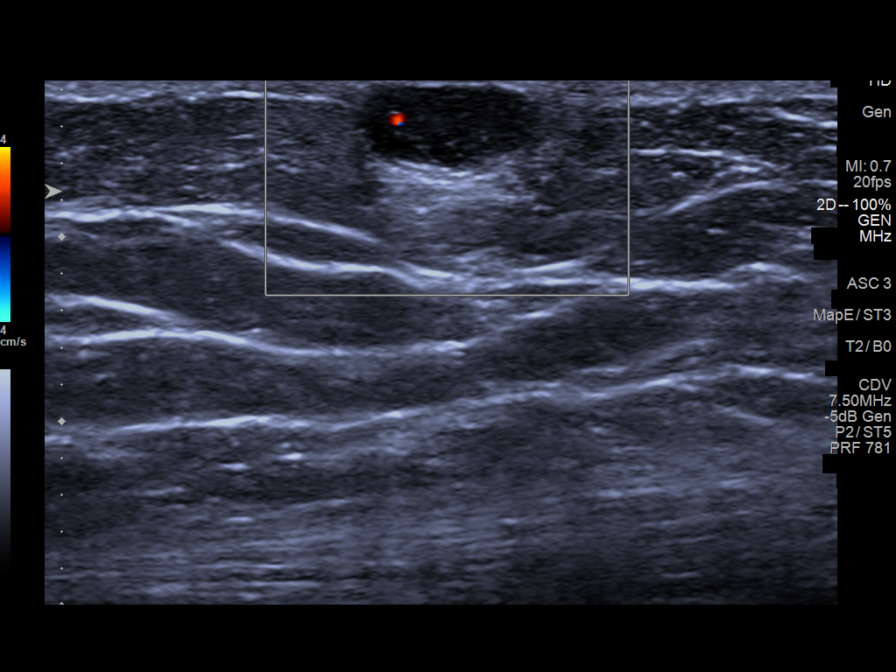

[8 of 8 positions shown; findings below may reference images not displayed]

ACR Breast Density Category b: There are scattered areas of
fibroglandular density.
FINDINGS: There is an oval circumscribed mass within the upper inner quadrant
of the RIGHT breast, at superficial depth and possibly in the skin,
measuring approximately 10 mm greatest dimension, corresponding to
the area of clinical concern with overlying skin marker in place.

There are no additional dominant masses, suspicious calcifications
or secondary signs of malignancy within the RIGHT breast.

There are no dominant masses, suspicious calcifications or secondary
signs of malignancy within the LEFT breast.

Mammographic images were processed with CAD.

Targeted ultrasound is performed, showing an oval circumscribed
hypoechoic mass within the RIGHT breast at the 1 o'clock axis, 12 cm
from the nipple, just below the skin, measuring 1 x 0.5 x 1 cm, with
small focus of internal vascularity, corresponding to the area of
clinical concern.
IMPRESSION: 1. RIGHT breast mass at the 1 o'clock axis, 12 cm from the nipple,
just below the skin, measuring 1 cm, with small focus of internal
vascularity, corresponding to the area of clinical concern.
Differential considerations include epidermal inclusion cyst,
fibroadenoma and cancer. As epidermal inclusion cyst is a
possibility, complete surgical excision from above is recommended as
a percutaneous ultrasound-guided biopsy could conceivably result in
a severe chemical mastitis.
2. No evidence of malignancy within the LEFT breast.

RECOMMENDATION:
Surgical excision (for definitive pathologic characterization) of
the RIGHT breast mass at the 1 o'clock axis, 12 cm from the nipple,
just below the skin, measuring 1 cm, corresponding to the area of
clinical concern.

Ordering physician will be contacted with today's results and
patient will then be scheduled for surgical consultation at her
earliest convenience.

I have discussed the findings and recommendations with the patient.
Results were also provided in writing at the conclusion of the
visit. If applicable, a reminder letter will be sent to the patient
regarding the next appointment.

BI-RADS CATEGORY  4: Suspicious.

## 2019-11-23 ENCOUNTER — Ambulatory Visit (INDEPENDENT_AMBULATORY_CARE_PROVIDER_SITE_OTHER)
Admit: 2019-11-23 | Discharge: 2019-11-23 | Disposition: A | Discharge status: Home / Self Care | Payer: 59 | Attending: Family Medicine | Admitting: Family Medicine

## 2019-11-23 ENCOUNTER — Ambulatory Visit
Admission: EM | Admit: 2019-11-23 | Discharge: 2019-11-23 | Disposition: A | Discharge status: Home / Self Care | Payer: 59 | Attending: Family Medicine | Admitting: Family Medicine

## 2019-11-23 ENCOUNTER — Encounter: Payer: Self-pay | Admitting: Emergency Medicine

## 2019-11-23 ENCOUNTER — Other Ambulatory Visit: Payer: Self-pay

## 2019-11-23 DIAGNOSIS — T839XXA Unspecified complication of genitourinary prosthetic device, implant and graft, initial encounter: Secondary | ICD-10-CM

## 2019-11-23 DIAGNOSIS — K529 Noninfective gastroenteritis and colitis, unspecified: Secondary | ICD-10-CM

## 2019-11-23 DIAGNOSIS — R1032 Left lower quadrant pain: Secondary | ICD-10-CM

## 2019-11-23 MED ORDER — KETOROLAC TROMETHAMINE 10 MG PO TABS: 10.0000 mg | ORAL_TABLET | Freq: Four times a day (QID) | ORAL | 0 refills | Status: AC | PRN

## 2019-11-23 NOTE — ED Notes (Signed)
PA# [id number]

## 2019-11-23 NOTE — ED Provider Notes (Addendum)
[facility]    CSN: [id number] Arrival date & time: 11/23/19  1527  History   Chief Complaint Chief Complaint  Patient presents with  . Diarrhea  . Abdominal Pain    HPI 39 year old female presents with abdominal pain.  Patient reports she has had ongoing chronic diarrhea since she has C. difficile 1.5 years ago.  Patient reports in the past 2 days she has had lower abdominal pain which she describes as a cramping sensation.  She states that there has been no worsening in her diarrhea.  She does note that she believes she has had some specks of blood in her stool.  Abdominal cramping has been quite severe at times.  Preventing her from going to work.  Pain 8/10 in severity.  No relieving factors.  No nausea or vomiting.  Patient has an IUD in place.  No other associated symptoms.  No other complaints.  Past Medical History:  Diagnosis Date  . Depression   . Headache   . Herpes genitalia   . Migraine   . Restless leg syndrome     Patient Active Problem List   Diagnosis Date Noted  . Umbilical hernia without obstruction and without gangrene 10/10/2014    Past Surgical History:  Procedure Laterality Date  . LEEP  2002  . UMBILICAL HERNIA REPAIR      OB History    Gravida  0   Para  0   Term  0   Preterm  0   AB  0   Living  0     SAB  0   TAB  0   Ectopic  0   Multiple  0   Live Births  0            Home Medications    Prior to Admission medications   Medication Sig Start Date End Date Taking? Authorizing Provider  albuterol (VENTOLIN HFA) 108 (90 Base) MCG/ACT inhaler INHALE 2 PUFFS INTO THE LUNGS EVERY 4 HOURS AS NEEDED FOR WHEEZING 04/11/19  Yes [provider]  DULoxetine (CYMBALTA) 60 MG capsule Take by mouth. 04/18/19 04/17/20 Yes [provider]  estradiol (VIVELLE-DOT) 0.1 MG/24HR patch Place onto the skin. 08/24/19  Yes [provider]  fluticasone (FLONASE) 50 MCG/ACT nasal spray Place 2 sprays into  both nostrils daily. 06/07/18  Yes Lutricia Feil, PA-C  levonorgestrel (MIRENA) 20 MCG/24HR IUD 1 each by Intrauterine route once.   Yes [provider]  valACYclovir (VALTREX) 500 MG tablet Take by mouth. 09/01/19  Yes [provider]  ketorolac (TORADOL) 10 MG tablet Take 1 tablet (10 mg total) by mouth every 6 (six) hours as needed for moderate pain or severe pain. 11/23/19   Tommie Sams, DO    Family History Family History  Problem Relation Age of Onset  . Hypertension Mother   . Depression Mother   . Hypertension Father   . Hyperlipidemia Father   . Skin cancer Father     Social History Social History   Tobacco Use  . Smoking status: Current Every Day Smoker    Packs/day: 0.25    Types: Cigarettes    Start date: 02/03/2010  . Smokeless tobacco: Never Used  Substance Use Topics  . Alcohol use: Yes    Alcohol/week: 5.0 standard drinks    Types: 3 Cans of beer, 2 Shots of liquor per week  . Drug use: No     Allergies   Patient has no  known allergies.   Review of Systems Review of Systems  Constitutional: Negative for fever.  Gastrointestinal: Positive for abdominal pain and diarrhea.   Physical Exam Triage Vital Signs ED Triage Vitals  Enc Vitals Group     BP 11/23/19 1550 (!) 144/86     Pulse Rate 11/23/19 1550 82     Resp 11/23/19 1550 18     Temp 11/23/19 1550 98.5 F (36.9 C)     Temp Source 11/23/19 1550 Oral     SpO2 11/23/19 1550 100 %     Weight 11/23/19 1547 192 lb (87.1 kg)     Height 11/23/19 1547 5\' 3"  (1.6 m)     Head Circumference --      Peak Flow --      Pain Score 11/23/19 1547 8     Pain Loc --      Pain Edu? --      Excl. in [place]? --    Updated Vital Signs BP (!) 144/86 (BP Location: Right Arm)   Pulse 82   Temp 98.5 F (36.9 C) (Oral)   Resp 18   Ht 5\' 3"  (1.6 m)   Wt 87.1 kg   SpO2 100%   BMI 34.01 kg/m   Visual Acuity Right Eye Distance:   Left Eye Distance:   Bilateral Distance:    Right Eye  Near:   Left Eye Near:    Bilateral Near:     Physical Exam Vitals and nursing note reviewed.  Constitutional:      General: She is not in acute distress.    Appearance: She is well-developed. She is not ill-appearing.  HENT:     Head: Normocephalic and atraumatic.  Eyes:     General:        Right eye: No discharge.        Left eye: No discharge.     Conjunctiva/sclera: Conjunctivae normal.  Cardiovascular:     Rate and Rhythm: Normal rate and regular rhythm.  Pulmonary:     Effort: Pulmonary effort is normal. No respiratory distress.     Breath sounds: No wheezing or rales.  Abdominal:     General: There is no distension.     Palpations: Abdomen is soft.     Comments: Exquisite tenderness to palpation in the LLQ.    Neurological:     Mental Status: She is alert.  Psychiatric:        Mood and Affect: Mood normal.        Behavior: Behavior normal.    UC Treatments / Results  Labs (all labs ordered are listed, but only abnormal results are displayed) Labs Reviewed - No data to display  EKG   Radiology CT ABDOMEN PELVIS WO CONTRAST  Result Date: 11/23/2019 CLINICAL DATA:  Diarrhea for 2 days, abdominal cramping and pain EXAM: CT ABDOMEN AND PELVIS WITHOUT CONTRAST TECHNIQUE: Multidetector CT imaging of the abdomen and pelvis was performed following the standard protocol without IV contrast. COMPARISON:  None. FINDINGS: Lower chest: No acute pleural or parenchymal lung disease. Hepatobiliary: Calcified gallstone is seen within the gallbladder fundus, with no evidence of acute cholecystitis. Unenhanced imaging of the liver is unremarkable. Pancreas: Unremarkable. No pancreatic ductal dilatation or surrounding inflammatory changes. Spleen: Normal in size without focal abnormality. Adrenals/Urinary Tract: No urinary tract calculi or obstructive uropathy. The adrenals and bladder are unremarkable. Stomach/Bowel: No bowel obstruction or ileus. Normal appendix right lower quadrant. No  bowel wall thickening or inflammatory change. Vascular/Lymphatic: No significant  vascular findings are present. No enlarged abdominal or pelvic lymph nodes. Reproductive: There is an IUD within the uterus. The right arm of the IUD extends to the serosal margin of the uterus, without definite penetration. There are no adnexal masses. Other: Previous umbilical hernia repair. No abdominal wall hernia on today's exam. No free fluid or free gas. Musculoskeletal: No acute or destructive bony lesions. Reconstructed images demonstrate no additional findings. IMPRESSION: 1. Cholelithiasis without evidence of acute cholecystitis. 2. No urinary tract calculi or obstructive uropathy. 3. Extension of the right arm of the IUD to the serosal margin of the uterus, compatible with erosion through the myometrium. No evidence of perforation at this time. Gynecologic consultation recommended for follow-up. Electronically Signed   By: Sharlet Salina M.D.   On: 11/23/2019 17:39    Procedures Procedures (including critical care time)  Medications Ordered in UC Medications - No data to display  Initial Impression / Assessment and Plan / UC Course  I have reviewed the triage vital signs and the nursing notes.  Pertinent labs & imaging results that were available during my care of the patient were reviewed by me and considered in my medical decision making (see chart for details).    39 year old female presents with abdominal pain in the setting of chronic diarrhea. Given severe pain and abdominal exam, CT obtained today. CT revealed cholelithiasis without evidence of cholecystitis.  Also revealed elevation of the right arm of her IUD into the myometrium of the uterus.  OB/GYN was consulted via Epic and advised that patient okay to follow up tomorrow.  Toradol as needed for pain.  Patient will call her OB/GYN in the morning.  Final Clinical Impressions(s) / UC Diagnoses   Final diagnoses:  LLQ abdominal pain  Chronic  diarrhea  Complication of intrauterine device (IUD), unspecified complication, initial encounter [facility])     Discharge Instructions     I will call with the results.  Take care  Dr. Adriana Simas     ED Prescriptions    Medication Sig Dispense Auth. Provider   ketorolac (TORADOL) 10 MG tablet Take 1 tablet (10 mg total) by mouth every 6 (six) hours as needed for moderate pain or severe pain. 20 tablet Tommie Sams, DO     PDMP not reviewed this encounter.     Tommie Sams, [place] 11/23/19 2240

## 2019-11-23 NOTE — ED Triage Notes (Signed)
Patient c/o diarrhea 7-8 times per day x 2 days. She states this has been going on for over 6 months on/off but restarted 2 days ago. Says she was diagnosed with c-diff about 1.5 years ago this feels the same. Patient c/o abdominal cramping and pain. Denies vomiting, denies fever.

## 2019-11-23 NOTE — Discharge Instructions (Signed)
I will call with the results.  Take care  Dr. Salote Weidmann
